# Patient Record
Sex: Male | Born: 2005 | Race: White | Hispanic: No | Marital: Single | Smoking: Never smoker
Health system: Southern US, Community
[De-identification: ages and names within clinical notes are randomized; demographics above are authoritative.]

---
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
## 2023-10-05 ENCOUNTER — Other Ambulatory Visit: Payer: Self-pay

## 2023-10-05 ENCOUNTER — Emergency Department
Admission: EM | Admit: 2023-10-05 | Discharge: 2023-10-05 | Disposition: A | Discharge status: Home / Self Care | Payer: Worker's Compensation | Attending: Student in an Organized Health Care Education/Training Program | Admitting: Student in an Organized Health Care Education/Training Program

## 2023-10-05 DIAGNOSIS — X131XXA Other contact with steam and other hot vapors, initial encounter: Secondary | ICD-10-CM | POA: Diagnosis not present

## 2023-10-05 DIAGNOSIS — Y990 Civilian activity done for income or pay: Secondary | ICD-10-CM | POA: Diagnosis not present

## 2023-10-05 DIAGNOSIS — T23161A Burn of first degree of back of right hand, initial encounter: Secondary | ICD-10-CM | POA: Diagnosis present

## 2023-10-05 DIAGNOSIS — T310 Burns involving less than 10% of body surface: Secondary | ICD-10-CM | POA: Diagnosis not present

## 2023-10-05 DIAGNOSIS — X118XXA Contact with other hot tap-water, initial encounter: Secondary | ICD-10-CM | POA: Diagnosis not present
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
## 2023-10-05 NOTE — ED Notes (Signed)
Workers comp completed by ED tech at this time.
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
## 2023-10-05 NOTE — Discharge Instructions (Signed)
You may continue to apply cool compresses to your hand.  Please return for any new, worsening, or change in symptoms or other concerns.  It was a pleasure caring for you today.
